# Patient Record
Sex: Female | Born: 1990 | Hispanic: Yes | Marital: Married | Smoking: Never smoker
Health system: Southern US, Community
[De-identification: ages and names within clinical notes are randomized; demographics above are authoritative.]

## PROBLEM LIST (undated history)

## (undated) DIAGNOSIS — Z789 Other specified health status: Secondary | ICD-10-CM

## (undated) HISTORY — DX: Other specified health status: Z78.9

## (undated) HISTORY — PX: NO PAST SURGERIES: SHX2092

---

## 2010-10-18 ENCOUNTER — Inpatient Hospital Stay (HOSPITAL_COMMUNITY)
Admission: AD | Admit: 2010-10-18 | Discharge: 2010-10-21 | DRG: 775 | Disposition: A | Discharge status: Home / Self Care | Payer: Medicaid Other | Source: Ambulatory Visit | Attending: Obstetrics & Gynecology | Admitting: Obstetrics & Gynecology

## 2010-10-18 DIAGNOSIS — O480 Post-term pregnancy: Principal | ICD-10-CM | POA: Diagnosis present

## 2010-10-18 LAB — CBC
MCHC: 35.1 g/dL (ref 30.0–36.0)
RDW: 13.9 % (ref 11.5–15.5)
WBC: 7.6 10*3/uL (ref 4.0–10.5)

## 2010-10-19 DIAGNOSIS — O480 Post-term pregnancy: Secondary | ICD-10-CM

## 2010-10-19 LAB — RPR: RPR Ser Ql: NONREACTIVE

## 2014-11-11 LAB — OB RESULTS CONSOLE GBS
GBS: POSITIVE
STREP GROUP B AG: POSITIVE
STREP GROUP B AG: POSITIVE

## 2014-11-14 ENCOUNTER — Inpatient Hospital Stay (HOSPITAL_COMMUNITY): Payer: Medicaid Other

## 2014-12-08 ENCOUNTER — Inpatient Hospital Stay (HOSPITAL_COMMUNITY)
Admission: AD | Admit: 2014-12-08 | Discharge: 2014-12-10 | DRG: 775 | Disposition: A | Discharge status: Home / Self Care | Payer: BLUE CROSS/BLUE SHIELD | Source: Ambulatory Visit | Attending: Obstetrics and Gynecology | Admitting: Obstetrics and Gynecology

## 2014-12-08 ENCOUNTER — Encounter (HOSPITAL_COMMUNITY): Payer: Self-pay | Admitting: *Deleted

## 2014-12-08 DIAGNOSIS — O133 Gestational [pregnancy-induced] hypertension without significant proteinuria, third trimester: Secondary | ICD-10-CM | POA: Diagnosis present

## 2014-12-08 DIAGNOSIS — Z3A39 39 weeks gestation of pregnancy: Secondary | ICD-10-CM | POA: Diagnosis present

## 2014-12-08 DIAGNOSIS — Z3490 Encounter for supervision of normal pregnancy, unspecified, unspecified trimester: Secondary | ICD-10-CM

## 2014-12-08 DIAGNOSIS — O99824 Streptococcus B carrier state complicating childbirth: Secondary | ICD-10-CM | POA: Diagnosis present

## 2014-12-08 LAB — OB RESULTS CONSOLE GC/CHLAMYDIA
Chlamydia: NEGATIVE
GC PROBE AMP, GENITAL: NEGATIVE

## 2014-12-08 LAB — OB RESULTS CONSOLE HEPATITIS B SURFACE ANTIGEN: Hepatitis B Surface Ag: NEGATIVE

## 2014-12-08 LAB — CBC
HEMATOCRIT: 33.3 % — AB (ref 36.0–46.0)
HEMOGLOBIN: 11 g/dL — AB (ref 12.0–15.0)
MCH: 25.3 pg — AB (ref 26.0–34.0)
MCHC: 33 g/dL (ref 30.0–36.0)
MCV: 76.7 fL — ABNORMAL LOW (ref 78.0–100.0)
Platelets: 214 10*3/uL (ref 150–400)
RBC: 4.34 MIL/uL (ref 3.87–5.11)
RDW: 15.2 % (ref 11.5–15.5)
WBC: 6.7 10*3/uL (ref 4.0–10.5)

## 2014-12-08 LAB — OB RESULTS CONSOLE ANTIBODY SCREEN: Antibody Screen: NEGATIVE

## 2014-12-08 LAB — OB RESULTS CONSOLE RPR: RPR: NONREACTIVE

## 2014-12-08 LAB — OB RESULTS CONSOLE ABO/RH: RH Type: POSITIVE

## 2014-12-08 LAB — TYPE AND SCREEN
ABO/RH(D): A POS
Antibody Screen: NEGATIVE

## 2014-12-08 LAB — OB RESULTS CONSOLE RUBELLA ANTIBODY, IGM: RUBELLA: IMMUNE

## 2014-12-08 LAB — ABO/RH: ABO/RH(D): A POS

## 2014-12-08 LAB — OB RESULTS CONSOLE HIV ANTIBODY (ROUTINE TESTING): HIV: NONREACTIVE

## 2014-12-08 LAB — OB RESULTS CONSOLE GBS
STREP GROUP B AG: NEGATIVE
STREP GROUP B AG: POSITIVE

## 2014-12-08 MED ORDER — TERBUTALINE SULFATE 1 MG/ML IJ SOLN: 0.2500 mg | Freq: Once | INTRAMUSCULAR | Status: AC | PRN

## 2014-12-08 MED ORDER — LIDOCAINE HCL (PF) 1 % IJ SOLN
30.0000 mL | INTRAMUSCULAR | Status: DC | PRN
  Filled 2014-12-08: qty 30

## 2014-12-08 MED ORDER — LACTATED RINGERS IV SOLN
INTRAVENOUS | Status: DC
  Administered 2014-12-08 – 2014-12-09 (×3): via INTRAVENOUS

## 2014-12-08 MED ORDER — LACTATED RINGERS IV SOLN: 500.0000 mL | INTRAVENOUS | Status: DC | PRN

## 2014-12-08 MED ORDER — ACETAMINOPHEN 325 MG PO TABS: 650.0000 mg | ORAL_TABLET | ORAL | Status: DC | PRN

## 2014-12-08 MED ORDER — CITRIC ACID-SODIUM CITRATE 334-500 MG/5ML PO SOLN: 30.0000 mL | ORAL | Status: DC | PRN

## 2014-12-08 MED ORDER — PENICILLIN G POTASSIUM 5000000 UNITS IJ SOLR
5.0000 10*6.[IU] | Freq: Once | INTRAVENOUS | Status: AC
  Administered 2014-12-08: 5 10*6.[IU] via INTRAVENOUS
  Filled 2014-12-08: qty 5

## 2014-12-08 MED ORDER — OXYTOCIN 40 UNITS IN LACTATED RINGERS INFUSION - SIMPLE MED
62.5000 mL/h | INTRAVENOUS | Status: DC
  Administered 2014-12-09: 62.5 mL/h via INTRAVENOUS

## 2014-12-08 MED ORDER — PENICILLIN G POTASSIUM 5000000 UNITS IJ SOLR
2.5000 10*6.[IU] | INTRAVENOUS | Status: DC
  Administered 2014-12-08 – 2014-12-09 (×3): 2.5 10*6.[IU] via INTRAVENOUS
  Filled 2014-12-08 (×8): qty 2.5

## 2014-12-08 MED ORDER — OXYTOCIN 40 UNITS IN LACTATED RINGERS INFUSION - SIMPLE MED
1.0000 m[IU]/min | INTRAVENOUS | Status: DC
  Administered 2014-12-08: 1 m[IU]/min via INTRAVENOUS
  Administered 2014-12-09: 5 m[IU]/min via INTRAVENOUS
  Filled 2014-12-08: qty 1000

## 2014-12-08 MED ORDER — BUTORPHANOL TARTRATE 1 MG/ML IJ SOLN
1.0000 mg | Freq: Once | INTRAMUSCULAR | Status: AC
  Administered 2014-12-08: 1 mg via INTRAVENOUS
  Filled 2014-12-08: qty 1

## 2014-12-08 MED ORDER — OXYCODONE-ACETAMINOPHEN 5-325 MG PO TABS: 1.0000 | ORAL_TABLET | ORAL | Status: DC | PRN

## 2014-12-08 MED ORDER — OXYCODONE-ACETAMINOPHEN 5-325 MG PO TABS: 2.0000 | ORAL_TABLET | ORAL | Status: DC | PRN

## 2014-12-08 MED ORDER — OXYTOCIN BOLUS FROM INFUSION
500.0000 mL | INTRAVENOUS | Status: DC
  Administered 2014-12-09: 500 mL via INTRAVENOUS

## 2014-12-08 NOTE — Progress Notes (Signed)
Patient ID: Sarah Harris, female   DOB: [birth year], 24 y.o.   MRN: [id number] Pt is contracting regularly q 2-3 minutes and they are more painful. Per the RN she waas 3-4 cm 50% effaced at 8:30 PM and now the cervix is unchanged She needs more pitocin.

## 2014-12-08 NOTE — H&P (Signed)
Sarah Harris, Sarah Harris NO.:  [id number]  MEDICAL RECORD NO.:  [id number]  LOCATION:  9174                          FACILITY:  [facility]  PHYSICIAN:  Malachi Pro. Ambrose Mantle, M.D. DATE OF BIRTH:  03-15-1991  DATE OF ADMISSION:  12/08/2014 DATE OF DISCHARGE:                             HISTORY & PHYSICAL   HISTORY OF PRESENT ILLNESS:  This is a 24 year old Hispanic female, para 1-0-0-1, gravida 2, with [facility] December 14, 2014, admitted for induction of labor.  This patient had a vaginal delivery with no epidural three years ago.  She presented for her prenatal care at [redacted] weeks gestation, and her EDC was given by the ultrasound at 10 weeks and 1 day as December 13, 2014 and that was the due date.  So, she is 39 weeks and 2 days.  Her prenatal labs showed A positive, negative antibody.  RPR negative. Urine culture negative.  Hepatitis B surface antigen negative, HIV negative, GC and Chlamydia negative.  Varicella immune.  Rubella immune. Pap smear normal.  One-hour Glucola 84.  Repeat RPR and HIV were negative.  Repeat GC and Chlamydia were negative.  Group B strep was positive.  She declined screening tests.  At one point at 15 weeks, she did have an upper respiratory infection.  She was advised to use conservative measures and report any temperature greater than 100.4 degrees.  Her prenatal care was complicated somewhat by poor weight gain.  Total weight gain has been 14.9 pounds.  At 36 weeks, the vertex was not felt to be breeched by palpation, but I did an ultrasound at 38 weeks, and vertex was confirmed by ultrasound.  On 12/08/2014, the cervix was 2 cm, 20%, vertex at a -3; and she was sent to the hospital for induction of labor.  PAST MEDICAL HISTORY:  Reveals no known significant medical illnesses.  PAST SURGICAL HISTORY:  She has had no surgeries.  ALLERGIES:  She is not allergic to any medicines.  SOCIAL HISTORY:  She never smoked.  Denies alcohol, denies drugs.  She has  12 years of education.  She works as a Designer, industrial/product at [employer]. She is married.  FAMILY HISTORY:  Maternal grandfather died of cancer of the lung and diabetes.  Sister has spina bifida occulta.  PHYSICAL EXAMINATION:  GENERAL:  Physical exam on admission, a well- developed, well-nourished Hispanic female, in no distress. VITAL SIGNS:  Blood pressure 100/60, pulse of 80. HEART:  Normal size and sounds.  No murmurs. LUNGS:  Clear to auscultation. ABDOMEN:  Fundal height is 37.5 cm.  Fetal heart tones are normal. Cervix is 2 cm, 20%-30% effaced, vertex at a -3.  ADMITTING IMPRESSION:  Intrauterine pregnancy at 39 weeks and 2 days. Positive group B strep.  The patient is admitted for induction of labor.     Malachi Pro. Ambrose Mantle, M.D.     TFH/MEDQ  D:  12/08/2014  T:  12/08/2014  Job:  [id number]

## 2014-12-08 NOTE — Progress Notes (Signed)
Patient ID: Sarah Harris, female   DOB: [birth year], 24 y.o.   MRN: [id number] Pitocin at 11 mu/minute and contractions painless and q 2-3 minutes The cervix is 2 cm 30% effaced and the vertex is at - 3 station. AROM produced clear fluid

## 2014-12-08 NOTE — Progress Notes (Signed)
Patient ID: Sarah Harris, female   DOB: [birth year], 24 y.o.   MRN: [id number]  Cervix is 2 cm 40 % effaced and the vertex is at - 3 station.

## 2014-12-09 ENCOUNTER — Encounter (HOSPITAL_COMMUNITY): Payer: Self-pay | Admitting: *Deleted

## 2014-12-09 LAB — RPR: RPR Ser Ql: NONREACTIVE

## 2014-12-09 MED ORDER — OXYTOCIN 40 UNITS IN LACTATED RINGERS INFUSION - SIMPLE MED: 62.5000 mL/h | INTRAVENOUS | Status: AC

## 2014-12-09 MED ORDER — DIPHENHYDRAMINE HCL 50 MG/ML IJ SOLN: 12.5000 mg | INTRAMUSCULAR | Status: DC | PRN

## 2014-12-09 MED ORDER — PHENYLEPHRINE 40 MCG/ML (10ML) SYRINGE FOR IV PUSH (FOR BLOOD PRESSURE SUPPORT): 80.0000 ug | PREFILLED_SYRINGE | INTRAVENOUS | Status: DC | PRN

## 2014-12-09 MED ORDER — TETANUS-DIPHTH-ACELL PERTUSSIS 5-2.5-18.5 LF-MCG/0.5 IM SUSP: 0.5000 mL | Freq: Once | INTRAMUSCULAR | Status: DC

## 2014-12-09 MED ORDER — LANOLIN HYDROUS EX OINT: TOPICAL_OINTMENT | CUTANEOUS | Status: DC | PRN

## 2014-12-09 MED ORDER — BENZOCAINE-MENTHOL 20-0.5 % EX AERO: 1.0000 "application " | INHALATION_SPRAY | CUTANEOUS | Status: DC | PRN

## 2014-12-09 MED ORDER — LACTATED RINGERS IV SOLN: INTRAVENOUS | Status: AC

## 2014-12-09 MED ORDER — ONDANSETRON HCL 4 MG/2ML IJ SOLN
4.0000 mg | INTRAMUSCULAR | Status: DC | PRN
Start: 2014-12-09 — End: 2014-12-10

## 2014-12-09 MED ORDER — DIPHENHYDRAMINE HCL 25 MG PO CAPS: 25.0000 mg | ORAL_CAPSULE | Freq: Four times a day (QID) | ORAL | Status: DC | PRN

## 2014-12-09 MED ORDER — PRENATAL MULTIVITAMIN CH
1.0000 | ORAL_TABLET | Freq: Every day | ORAL | Status: DC
  Administered 2014-12-10: 1 via ORAL
  Filled 2014-12-09: qty 1

## 2014-12-09 MED ORDER — SIMETHICONE 80 MG PO CHEW: 80.0000 mg | CHEWABLE_TABLET | ORAL | Status: DC | PRN

## 2014-12-09 MED ORDER — MEASLES, MUMPS & RUBELLA VAC ~~LOC~~ INJ: 0.5000 mL | INJECTION | Freq: Once | SUBCUTANEOUS | Status: DC

## 2014-12-09 MED ORDER — OXYCODONE-ACETAMINOPHEN 5-325 MG PO TABS: 2.0000 | ORAL_TABLET | ORAL | Status: DC | PRN

## 2014-12-09 MED ORDER — PHENYLEPHRINE 40 MCG/ML (10ML) SYRINGE FOR IV PUSH (FOR BLOOD PRESSURE SUPPORT)
PREFILLED_SYRINGE | INTRAVENOUS | Status: AC
  Filled 2014-12-09: qty 20

## 2014-12-09 MED ORDER — FENTANYL 2.5 MCG/ML BUPIVACAINE 1/10 % EPIDURAL INFUSION (WH - ANES): 14.0000 mL/h | INTRAMUSCULAR | Status: DC | PRN

## 2014-12-09 MED ORDER — WITCH HAZEL-GLYCERIN EX PADS: 1.0000 "application " | MEDICATED_PAD | CUTANEOUS | Status: DC | PRN

## 2014-12-09 MED ORDER — ZOLPIDEM TARTRATE 5 MG PO TABS: 5.0000 mg | ORAL_TABLET | Freq: Every evening | ORAL | Status: DC | PRN

## 2014-12-09 MED ORDER — ONDANSETRON HCL 4 MG PO TABS: 4.0000 mg | ORAL_TABLET | ORAL | Status: DC | PRN

## 2014-12-09 MED ORDER — IBUPROFEN 600 MG PO TABS
600.0000 mg | ORAL_TABLET | Freq: Four times a day (QID) | ORAL | Status: DC
  Administered 2014-12-09 – 2014-12-10 (×6): 600 mg via ORAL
  Filled 2014-12-09 (×6): qty 1

## 2014-12-09 MED ORDER — ACETAMINOPHEN 325 MG PO TABS: 650.0000 mg | ORAL_TABLET | ORAL | Status: DC | PRN

## 2014-12-09 MED ORDER — DIBUCAINE 1 % RE OINT: 1.0000 "application " | TOPICAL_OINTMENT | RECTAL | Status: DC | PRN

## 2014-12-09 MED ORDER — BUTORPHANOL TARTRATE 1 MG/ML IJ SOLN
1.0000 mg | INTRAMUSCULAR | Status: DC | PRN
  Administered 2014-12-09: 1 mg via INTRAVENOUS
  Filled 2014-12-09: qty 1

## 2014-12-09 MED ORDER — OXYCODONE-ACETAMINOPHEN 5-325 MG PO TABS
1.0000 | ORAL_TABLET | ORAL | Status: DC | PRN
  Administered 2014-12-09: 1 via ORAL
  Filled 2014-12-09: qty 1

## 2014-12-09 MED ORDER — FENTANYL 2.5 MCG/ML BUPIVACAINE 1/10 % EPIDURAL INFUSION (WH - ANES)
INTRAMUSCULAR | Status: AC
  Filled 2014-12-09: qty 125

## 2014-12-09 MED ORDER — SENNOSIDES-DOCUSATE SODIUM 8.6-50 MG PO TABS
2.0000 | ORAL_TABLET | ORAL | Status: DC
Start: 2014-12-10 — End: 2014-12-10
  Administered 2014-12-09: 2 via ORAL
  Filled 2014-12-09: qty 2

## 2014-12-09 MED ORDER — PRENATAL MULTIVITAMIN CH: 1.0000 | ORAL_TABLET | Freq: Every day | ORAL | Status: DC

## 2014-12-09 MED ORDER — EPHEDRINE 5 MG/ML INJ: 10.0000 mg | INTRAVENOUS | Status: DC | PRN

## 2014-12-09 NOTE — Progress Notes (Signed)
Patient ID: Sarah Harris, female   DOB: [birth year], 24 y.o.   MRN: [id number] After the pitocin was d/c ed and the contractions spaced out the pitocin was begun again at 5 mu/minute and now is at 9 mu/minute. The contractions are q 2-4 minutes. The FHR is normal and the cervix is 4-5 cm 80% effaced, anterior and the vertex is at - 2 station.

## 2014-12-09 NOTE — Lactation Note (Signed)
This note was copied from the chart of Sarah Harris. Lactation Consultation Note  Patient Name: Sarah Harris ZOXWR'U Date: 12/09/2014 Reason for consult: Initial assessment Mom had asked RN for bottle. Mom reports BR/BO with 1st child but had LMS by 1 month and stopped breastfeeding. LC demonstrated hand expression and colostrum drips from Mom's breast. Reviewed risk of early supplementation to BF success. Reviewed supply/demand. Encouraged Mom to keep baby at the breast, basic teaching reviewed. Lactation brochure left for review, advised of OP services and support group. Encouraged to call for assist as needed.   Maternal Data Has patient been taught Hand Expression?: Yes Does the patient have breastfeeding experience prior to this delivery?: Yes  Feeding Feeding Type: Breast Fed  LATCH Score/Interventions Latch: Grasps breast easily, tongue down, lips flanged, rhythmical sucking.  Audible Swallowing: A few with stimulation  Type of Nipple: Everted at rest and after stimulation  Comfort (Breast/Nipple): Soft / non-tender     Hold (Positioning): Assistance needed to correctly position infant at breast and maintain latch.  LATCH Score: 8  Lactation Tools Discussed/Used WIC Program: No   Consult Status Consult Status: Follow-up Date: 12/10/14 Follow-up type: In-patient    Alfred Levins 12/09/2014, 9:13 PM

## 2014-12-09 NOTE — Progress Notes (Signed)
Patient ID: Sarah Harris, female   DOB: January 27, 1991, 24 y.o.   MRN: [id number] I was called to the pt's room because she was feeling pressure and the RN could not feel the cervix.The cervix was 4-5 cm 60 % effaced and the vertex was at - 3  station The FHR was inadequately traced and suggested there may have been some bradycardia so the pitocin was stopped and an RN placed a scalp electrode  Confirming a normal FHR Without pitocinher contractions were initially unchanged but now have begun to space out.

## 2014-12-09 NOTE — Progress Notes (Signed)
Patient ID: Sarah Harris, female   DOB: [birth year], 24 y.o.   MRN: [id number] Delivery note:  The pt progressed from 4-5 cm over the next hour to 6 cm , then to an anterior lip. She was in severe pain so we asked for an epidural but by the time the epidural was available we felt she was too uncomfortable to sit still. She then progressed to full dilatation and pushed well to make very slow descent. She delivered a living female infant spontaneously LOA over an intact perineum. There was no shoulder dystocia. Apgars were 9 and 9 at 1 and 5 minutes. The placenta was intact and the uterus was normal. There were 2 very small lacerations on the right labia and above the urethral meatus that were hemostatic and not repaired. EBL 75 cc's.

## 2014-12-09 NOTE — Progress Notes (Signed)
Patient ID: Sarah Harris, female   DOB: 1991/04/11, 24 y.o.   MRN: [id number] DOD VS stable No complaints

## 2014-12-10 LAB — CBC
HCT: 33.4 % — ABNORMAL LOW (ref 36.0–46.0)
Hemoglobin: 10.6 g/dL — ABNORMAL LOW (ref 12.0–15.0)
MCH: 24.7 pg — ABNORMAL LOW (ref 26.0–34.0)
MCHC: 31.7 g/dL (ref 30.0–36.0)
MCV: 77.7 fL — ABNORMAL LOW (ref 78.0–100.0)
Platelets: 195 10*3/uL (ref 150–400)
RBC: 4.3 MIL/uL (ref 3.87–5.11)
RDW: 15.6 % — ABNORMAL HIGH (ref 11.5–15.5)
WBC: 11.9 10*3/uL — ABNORMAL HIGH (ref 4.0–10.5)

## 2014-12-10 MED ORDER — IBUPROFEN 600 MG PO TABS: 600.0000 mg | ORAL_TABLET | Freq: Four times a day (QID) | ORAL | Status: DC

## 2014-12-10 NOTE — Discharge Instructions (Signed)
Nothing in vagina for 6 weeks.  No sex, tampons, and douching.  Other instructions as in [facility] Discharge Booklet. °

## 2014-12-10 NOTE — Progress Notes (Signed)
OB Discharge Summary  Patient Name: Sarah Harris DOB: [birth year] MRN: [id number]  Date of admission: 12/08/2014     Date of discharge: No discharge date for patient encounter.  Admitting diagnosis: Onset of Labor   Intrauterine pregnancy: [redacted]w[redacted]d     Secondary diagnosis: None     Discharge diagnosis: Term Pregnancy Delivered  Method of delivery:      Information for the patient's newborn:  Athleen, Feltner [[id number]]  Delivery Method: Vaginal, Spontaneous Delivery (Filed from Delivery Summary)                                                                                                     Intrapartum Procedures:                                                          Post partum procedures:none      Hospital course:  Onset of Labor With Vaginal Delivery     24 y.o. yo G2P1001 at [redacted]w[redacted]d was admitted in Active Laboron 12/08/2014. Patient had an uncomplicated labor course as follows:       Length of Stage 3:                                    Mediations and procedures used include: Pitocin  Patient had a delivery of a Viable infant. 12/09/2014  Information for the patient's newborn:  Marijose, Curington [[id number]]  Delivery Method: Vaginal, Spontaneous Delivery (Filed from Delivery Summary)    Pateint had an uncomplicated postpartum course.  She is ambulating, tolerating a regular diet, passing flatus, and urinating well. Patient is discharged home in stable condition on No discharge date for patient encounter.Marland Kitchen    Physical exam  Filed Vitals:   12/09/14 1223 12/09/14 1353 12/09/14 1750 12/10/14 0515  BP: 119/70 109/66 117/65 98/54  Pulse: 66 65 70 63  Temp: 97.8 F (36.6 C) 98.8 F (37.1 C) 98.1 F (36.7 C) 98.3 F (36.8 C)  TempSrc: Oral Oral Oral   Resp: Height:      Weight:       General: alert, cooperative and no distress Lochia: appropriate Uterine Fundus: firm DVT Evaluation: No evidence of DVT seen on physical  exam. Labs: Lab Results  Component Value Date   WBC 11.9* 12/10/2014   HGB 10.6* 12/10/2014   HCT 33.4* 12/10/2014   MCV 77.7* 12/10/2014   PLT 195 12/10/2014   No flowsheet data found.  Discharge instruction: per After Visit Summary and "Baby and Me Booklet".  Medications:   Medication List    TAKE these medications        ibuprofen 600 MG tablet  Commonly known as:  ADVIL,MOTRIN  Take 1 tablet (600 mg total) by mouth every 6 (six) hours.     prenatal multivitamin  Tabs tablet  Take 1 tablet by mouth daily at 12 noon.        Diet: routine diet  Activity: Advance as tolerated. Pelvic rest for 6 weeks.   Outpatient follow up:6 weeks  Postpartum contraception: Nexplanon  Newborn Data: Live born female  Birth Weight: 6 lb 11.1 oz (3035 g) APGAR: 9, 9  Baby Feeding: Breast Disposition:home with mother   12/10/2014 Sharol Given Zenon Leaf, DO

## 2014-12-10 NOTE — Lactation Note (Signed)
This note was copied from the chart of Sarah Harris. Lactation Consultation Note  Discussed waking techniques, undressing baby to diaper for feedings and encouraged longer feedings. Suggest she breastfeed on both breasts, burping in between. Reviewed engorgement care and monitoring voids/stools and keep track.  Patient Name: Sarah Sirinity Outland ZOXWR'U Date: 12/10/2014 Reason for consult: Follow-up assessment   Maternal Data    Feeding Feeding Type: Breast Fed Length of feed: 26 min  LATCH Score/Interventions Latch: Grasps breast easily, tongue down, lips flanged, rhythmical sucking.  Audible Swallowing: A few with stimulation (Fell asleep shortly after starting to breastfeeding)  Type of Nipple: Everted at rest and after stimulation  Comfort (Breast/Nipple): Soft / non-tender     Hold (Positioning): Assistance needed to correctly position infant at breast and maintain latch.  LATCH Score: 8  Lactation Tools Discussed/Used     Consult Status Consult Status: Complete    Hardie Pulley 12/10/2014, 12:11 PM

## 2014-12-10 NOTE — Discharge Summary (Addendum)
OB Discharge Summary  Patient Name: Sarah Harris DOB: 11-Mar-1991 MRN: [id number]  Date of admission: 12/08/2014     Date of discharge: 12/10/14  Admitting diagnosis: Induction of labor    Intrauterine pregnancy: [redacted]w[redacted]d     Secondary diagnosis: Gestational Hypertension     Discharge diagnosis: Term Pregnancy Delivered  Method of delivery:      Information for the patient's newborn:  Rielynn, Trulson [[id number]]  Delivery Method: Vaginal, Spontaneous Delivery (Filed from Delivery Summary)                                                                                                      Intrapartum Procedures:                                                          Post partum procedures:none      Hospital course:  Induction of Labor With Vaginal Delivery   24 y.o. yo G2P1001 at [redacted]w[redacted]d was admitted to the hospital 12/08/2014 for induction of labor.  Indication for induction: Gestational hypertension.  Patient had an uncomplicated labor course as follows:  Mediations and procedures used include: Cytotec      Length of stage 3:                                       Patient had delivery of a Viable infant.  Information for the patient's newborn:  Pinkey, Mcjunkin [[id number]]  Delivery Method: Vaginal, Spontaneous Delivery (Filed from Delivery Summary)   12/09/2014  Details of delivery can be found in separate delivery note.  Patient had a routine postpartum course. Patient is discharged home No discharge date for patient encounter.    Physical exam  Filed Vitals:   12/09/14 1223 12/09/14 1353 12/09/14 1750 12/10/14 0515  BP: 119/70 109/66 117/65 98/54  Pulse: 66 65 70 63  Temp: 97.8 F (36.6 C) 98.8 F (37.1 C) 98.1 F (36.7 C) 98.3 F (36.8 C)  TempSrc: Oral Oral Oral   Resp: Height:      Weight:       General: alert, cooperative and no distress Lochia: appropriate Uterine Fundus: firm DVT Evaluation: No evidence of DVT seen on  physical exam. Labs: Lab Results  Component Value Date   WBC 11.9* 12/10/2014   HGB 10.6* 12/10/2014   HCT 33.4* 12/10/2014   MCV 77.7* 12/10/2014   PLT 195 12/10/2014   No flowsheet data found.  Discharge instruction: per After Visit Summary and "Baby and Me Booklet".  Medications:   Medication List    TAKE these medications        ibuprofen 600 MG tablet  Commonly known as:  ADVIL,MOTRIN  Take 1 tablet (600 mg total) by mouth every 6 (six) hours.  prenatal multivitamin Tabs tablet  Take 1 tablet by mouth daily at 12 noon.        Diet: routine diet  Activity: Advance as tolerated. Pelvic rest for 6 weeks.   Outpatient follow up:6 weeks  Postpartum contraception: Nexplanon  Newborn Data: Live born female  Birth Weight: 6 lb 11.1 oz (3035 g) APGAR: 9, 9  Baby Feeding: Breast Disposition:home with mother   12/10/2014 Sharol Given Banga, DO

## 2021-05-06 NOTE — L&D Delivery Note (Signed)
Operative Delivery Note    Pt c/o excessive pressure and began to involuntarily push at 9cm dilation.  She was placed in hands and knees and after several contractions the cervix was able to be reduced and she was completely dilated.  She was unable to labor down due to discomfort, and began pushing at 2115pm. We pushed for a little over an hour in multiple positions and she was able to bring the baby to a +2-+3 station, but could not get the head to crowning.  The FHR had some repetitive variables to 90's and recovered to about 105.  SHe was counseled on options and desired assistance to deliver with a vacuum. The team was called to attend and a mushroom vacuum was applied into the green zone. With no pop-offs over two contractions the baby was pulled to crowning. Over several more contractions the patient was able to deliver the head.  A tight nuchal was noted and delivered through.   Once the head delivered, there was a mild shoulder dystocia but with McRoberts  maneuver and anteriorly sweeping the posterior right shoulder forward, the shoulders easily delivered.  At 10:29 PM a healthy female was delivered via Vaginal, Vacuum Investment banker, operational).  Presentation: vertex; Position: Occiput,, Anterior; Station: +2.  Verbal consent: obtained from family.  Risks and benefits discussed in detail.  Risks include, but are not limited to the risks of anesthesia, bleeding, infection, damage to maternal tissues, fetal cephalhematoma.  There is also the risk of inability to effect vaginal delivery of the head, or shoulder dystocia that cannot be resolved by established maneuvers, leading to the need for emergency cesarean section.  APGAR: 8, 9; weight  .   Placenta status: delivered spontaneously .   Cord:  with the following complications: tight nuchal delivered through.    Anesthesia:  1%plain lidocaine block Instruments: mushroom  vacuum Episiotomy: None Lacerations: 2nd degree;Perineal Suture Repair: 2.0 vicryl  rapide Est. Blood Loss (mL):   Mom to postpartum.  Baby to Couplet care / Skin to Skin.  Oliver Pila 12/01/2021, 11:00 PM

## 2021-05-11 LAB — OB RESULTS CONSOLE ABO/RH: RH Type: POSITIVE

## 2021-05-11 LAB — OB RESULTS CONSOLE HEPATITIS B SURFACE ANTIGEN: Hepatitis B Surface Ag: NEGATIVE

## 2021-05-11 LAB — OB RESULTS CONSOLE RUBELLA ANTIBODY, IGM: Rubella: IMMUNE

## 2021-05-11 LAB — OB RESULTS CONSOLE GC/CHLAMYDIA
Chlamydia: NEGATIVE
Neisseria Gonorrhea: NEGATIVE

## 2021-05-11 LAB — OB RESULTS CONSOLE HIV ANTIBODY (ROUTINE TESTING): HIV: NONREACTIVE

## 2021-05-11 LAB — HEPATITIS C ANTIBODY: HCV Ab: NEGATIVE

## 2021-05-11 LAB — OB RESULTS CONSOLE ANTIBODY SCREEN: Antibody Screen: NEGATIVE

## 2021-05-18 ENCOUNTER — Other Ambulatory Visit: Payer: Self-pay

## 2021-07-12 ENCOUNTER — Other Ambulatory Visit: Payer: Self-pay | Admitting: Obstetrics and Gynecology

## 2021-07-12 DIAGNOSIS — Z3A20 20 weeks gestation of pregnancy: Secondary | ICD-10-CM

## 2021-07-12 DIAGNOSIS — Z363 Encounter for antenatal screening for malformations: Secondary | ICD-10-CM

## 2021-07-12 DIAGNOSIS — O283 Abnormal ultrasonic finding on antenatal screening of mother: Secondary | ICD-10-CM

## 2021-07-13 ENCOUNTER — Encounter: Payer: Self-pay | Admitting: *Deleted

## 2021-07-19 ENCOUNTER — Other Ambulatory Visit: Payer: Self-pay

## 2021-07-19 ENCOUNTER — Encounter: Payer: Self-pay | Admitting: *Deleted

## 2021-07-19 ENCOUNTER — Ambulatory Visit: Payer: BC Managed Care – PPO | Attending: Obstetrics and Gynecology

## 2021-07-19 ENCOUNTER — Ambulatory Visit: Payer: BC Managed Care – PPO | Admitting: *Deleted

## 2021-07-19 VITALS — BP 114/61 | HR 90

## 2021-07-19 DIAGNOSIS — Z363 Encounter for antenatal screening for malformations: Secondary | ICD-10-CM | POA: Diagnosis present

## 2021-07-19 DIAGNOSIS — O283 Abnormal ultrasonic finding on antenatal screening of mother: Secondary | ICD-10-CM | POA: Diagnosis present

## 2021-07-19 DIAGNOSIS — Z3A20 20 weeks gestation of pregnancy: Secondary | ICD-10-CM | POA: Diagnosis present

## 2021-09-10 LAB — OB RESULTS CONSOLE RPR: RPR: NONREACTIVE

## 2021-11-07 LAB — OB RESULTS CONSOLE GBS: GBS: NEGATIVE

## 2021-11-30 ENCOUNTER — Telehealth (HOSPITAL_COMMUNITY): Payer: Self-pay | Admitting: *Deleted

## 2021-11-30 ENCOUNTER — Encounter (HOSPITAL_COMMUNITY): Payer: Self-pay | Admitting: *Deleted

## 2021-11-30 ENCOUNTER — Other Ambulatory Visit: Payer: Self-pay | Admitting: Obstetrics and Gynecology

## 2021-11-30 DIAGNOSIS — Z3490 Encounter for supervision of normal pregnancy, unspecified, unspecified trimester: Secondary | ICD-10-CM

## 2021-11-30 NOTE — Telephone Encounter (Signed)
Preadmission screen  

## 2021-11-30 NOTE — H&P (Signed)
  The note originally documented on this encounter has been moved the the encounter in which it belongs.  

## 2021-11-30 NOTE — H&P (Signed)
Sarah Harris is a 31 y.o. female G3P2002 at 52 5/7 weeks (EDD 12/03/21 by LMP c/w 10 week [place]) presenting for IOL.  Prenatal care uneventful.   OB History     Gravida  3   Para  2   Term  2   Preterm  0   AB  0   Living  2      SAB  0   IAB  0   Ectopic  0   Multiple  0   Live Births  1         10-19-2010, 42 wks M, 7lbs 9oz, Vaginal Delivery  12-09-2014, 39.2 wks F, 6lbs 11oz, Vaginal Delivery Past Medical History:  Diagnosis Date   Medical history non-contributory    Past Surgical History:  Procedure Laterality Date   NO PAST SURGERIES     Family History: family history includes Cancer in her maternal grandfather; Diabetes in her mother and niece. Social History:  reports that she has never smoked. She has never used smokeless tobacco. She reports that she does not drink alcohol and does not use drugs.     Maternal Diabetes: No Genetic Screening: declined Maternal Ultrasounds/Referrals: Normal Fetal Ultrasounds or other Referrals:  Referred to Materal Fetal Medicine  Maternal Substance Abuse:  No Significant Maternal Medications:  None Significant Maternal Lab Results:  Group B Strep negative Number of Prenatal Visits:Less than or equal to 3 verified prenatal visits Other Comments:  None  Review of Systems  Constitutional:  Negative for fever.  Gastrointestinal:  Negative for abdominal pain.  Genitourinary:  Negative for vaginal bleeding.   Maternal Medical History:  Contractions: Frequency: irregular.   Perceived severity is mild.   Fetal activity: Perceived fetal activity is normal.   Prenatal Complications - Diabetes: none.     Last menstrual period 02/26/2021, unknown if currently breastfeeding. Maternal Exam:  Uterine Assessment: Contraction frequency is irregular.  Abdomen: Patient reports no abdominal tenderness. Fetal presentation: vertex Introitus: Normal vulva. Normal vagina.  Pelvis: adequate for delivery.     Physical  Exam Constitutional:      Appearance: Normal appearance.  Cardiovascular:     Rate and Rhythm: Normal rate and regular rhythm.  Pulmonary:     Effort: Pulmonary effort is normal.  Abdominal:     Palpations: Abdomen is soft.  Genitourinary:    General: Normal vulva.  Neurological:     General: No focal deficit present.     Mental Status: She is alert.  Psychiatric:        Mood and Affect: Mood normal.     Cervix 3+/50/-2   AROM clear  Prenatal labs: ABO, Rh: --/--/A POS (07/29 6301) Antibody: NEG (07/29 0921) Rubella: Immune (01/06 0000)immune RPR: NON REACTIVE (07/29 0921)  HBsAg: Negative (01/06 0000) negative HIV: Non-reactive (01/06 0000)  GBS: Negative/-- (07/05 0000)  One hour GCT 113 Hgb AA  Assessment/Plan: Pt admitted for elective IOL and started on pitocin, now with AROM.  Feeling mild contractions and baby category 1 tracing.   She plans a medication-free delivery. She expressed an interest in tubal sterilization, but had never mentioned previously.   We discussed that as an elective procedure anesthesia prefers regional anesthesia to perform immediately postpartum. She declines that option and states would rather do as an interval tubal with general anesthesia.    Oliver Pila 12/01/2021, 1:35 PM

## 2021-12-01 ENCOUNTER — Inpatient Hospital Stay (HOSPITAL_COMMUNITY): Payer: BC Managed Care – PPO

## 2021-12-01 ENCOUNTER — Inpatient Hospital Stay (HOSPITAL_COMMUNITY)
Admission: AD | Admit: 2021-12-01 | Discharge: 2021-12-03 | DRG: 807 | Disposition: A | Discharge status: Home / Self Care | Payer: BC Managed Care – PPO | Attending: Obstetrics and Gynecology | Admitting: Obstetrics and Gynecology

## 2021-12-01 ENCOUNTER — Encounter (HOSPITAL_COMMUNITY): Payer: Self-pay | Admitting: Obstetrics and Gynecology

## 2021-12-01 DIAGNOSIS — Z3A39 39 weeks gestation of pregnancy: Secondary | ICD-10-CM | POA: Diagnosis not present

## 2021-12-01 DIAGNOSIS — Z3490 Encounter for supervision of normal pregnancy, unspecified, unspecified trimester: Principal | ICD-10-CM | POA: Diagnosis present

## 2021-12-01 DIAGNOSIS — O26893 Other specified pregnancy related conditions, third trimester: Secondary | ICD-10-CM | POA: Diagnosis present

## 2021-12-01 LAB — CBC
HCT: 35.1 % — ABNORMAL LOW (ref 36.0–46.0)
Hemoglobin: 11.2 g/dL — ABNORMAL LOW (ref 12.0–15.0)
MCH: 24.9 pg — ABNORMAL LOW (ref 26.0–34.0)
MCHC: 31.9 g/dL (ref 30.0–36.0)
MCV: 78.2 fL — ABNORMAL LOW (ref 80.0–100.0)
Platelets: 211 10*3/uL (ref 150–400)
RBC: 4.49 MIL/uL (ref 3.87–5.11)
RDW: 15.1 % (ref 11.5–15.5)
WBC: 5.7 10*3/uL (ref 4.0–10.5)
nRBC: 0 % (ref 0.0–0.2)

## 2021-12-01 LAB — RPR: RPR Ser Ql: NONREACTIVE

## 2021-12-01 LAB — TYPE AND SCREEN
ABO/RH(D): A POS
Antibody Screen: NEGATIVE

## 2021-12-01 MED ORDER — ONDANSETRON HCL 4 MG/2ML IJ SOLN: 4.0000 mg | Freq: Four times a day (QID) | INTRAMUSCULAR | Status: DC | PRN

## 2021-12-01 MED ORDER — FENTANYL CITRATE (PF) 100 MCG/2ML IJ SOLN: 50.0000 ug | INTRAMUSCULAR | Status: DC | PRN

## 2021-12-01 MED ORDER — OXYTOCIN-SODIUM CHLORIDE 30-0.9 UT/500ML-% IV SOLN
2.5000 [IU]/h | INTRAVENOUS | Status: DC
  Administered 2021-12-01: 2.5 [IU]/h via INTRAVENOUS

## 2021-12-01 MED ORDER — ACETAMINOPHEN 325 MG PO TABS: 650.0000 mg | ORAL_TABLET | ORAL | Status: DC | PRN

## 2021-12-01 MED ORDER — TERBUTALINE SULFATE 1 MG/ML IJ SOLN: 0.2500 mg | Freq: Once | INTRAMUSCULAR | Status: DC | PRN

## 2021-12-01 MED ORDER — OXYTOCIN-SODIUM CHLORIDE 30-0.9 UT/500ML-% IV SOLN
1.0000 m[IU]/min | INTRAVENOUS | Status: DC
  Administered 2021-12-01 (×2): 2 m[IU]/min via INTRAVENOUS
  Filled 2021-12-01: qty 500

## 2021-12-01 MED ORDER — LIDOCAINE HCL (PF) 1 % IJ SOLN
30.0000 mL | INTRAMUSCULAR | Status: AC | PRN
  Administered 2021-12-01: 30 mL via SUBCUTANEOUS
  Filled 2021-12-01: qty 30

## 2021-12-01 MED ORDER — LACTATED RINGERS IV SOLN
500.0000 mL | INTRAVENOUS | Status: DC | PRN
  Administered 2021-12-01: 500 mL via INTRAVENOUS

## 2021-12-01 MED ORDER — SOD CITRATE-CITRIC ACID 500-334 MG/5ML PO SOLN: 30.0000 mL | ORAL | Status: DC | PRN

## 2021-12-01 MED ORDER — OXYCODONE-ACETAMINOPHEN 5-325 MG PO TABS: 2.0000 | ORAL_TABLET | ORAL | Status: DC | PRN

## 2021-12-01 MED ORDER — OXYCODONE-ACETAMINOPHEN 5-325 MG PO TABS: 1.0000 | ORAL_TABLET | ORAL | Status: DC | PRN

## 2021-12-01 MED ORDER — LACTATED RINGERS IV SOLN: INTRAVENOUS | Status: DC

## 2021-12-01 MED ORDER — OXYTOCIN BOLUS FROM INFUSION
333.0000 mL | Freq: Once | INTRAVENOUS | Status: AC
  Administered 2021-12-01: 333 mL via INTRAVENOUS

## 2021-12-01 NOTE — Lactation Note (Signed)
This note was copied from a baby's chart. Lactation Consultation Note Experienced BF mom declines Lactation services.  Patient Name: Sarah Harris Date: 12/01/2021   Age:31 hours  Maternal Data    Feeding    LATCH Score                    Lactation Tools Discussed/Used    Interventions    Discharge    Consult Status Consult Status: Complete    Charyl Dancer 12/01/2021, 11:29 PM

## 2021-12-01 NOTE — Progress Notes (Signed)
Patient ID: Sarah Harris, female   DOB: March 28, 1991, 31 y.o.   MRN: [id number] Pt starting to get more uncomfortable   Afeb VSS FHR category 1  Contractions q 2-3  Cervix 80/6/-1  I have to go to OR for emergent procedure, so will hold pitocin for now and restart when I am out of OR.  Pt coping with pain well.

## 2021-12-01 NOTE — Progress Notes (Signed)
Patient ID: Sarah Harris, female   DOB: 11-05-1990, 31 y.o.   MRN: [id number] Pt uncomfortable but coping well  Afeb VSS FHR category 1 basline 115-120  Cervix at 1900 80/7/-1  Pitocin restarted and will gradually increase to get contractions a bit closer

## 2021-12-02 ENCOUNTER — Other Ambulatory Visit: Payer: Self-pay

## 2021-12-02 ENCOUNTER — Encounter (HOSPITAL_COMMUNITY): Payer: Self-pay | Admitting: Obstetrics and Gynecology

## 2021-12-02 LAB — CBC
HCT: 33.9 % — ABNORMAL LOW (ref 36.0–46.0)
Hemoglobin: 10.9 g/dL — ABNORMAL LOW (ref 12.0–15.0)
MCH: 24.9 pg — ABNORMAL LOW (ref 26.0–34.0)
MCHC: 32.2 g/dL (ref 30.0–36.0)
MCV: 77.4 fL — ABNORMAL LOW (ref 80.0–100.0)
Platelets: 213 10*3/uL (ref 150–400)
RBC: 4.38 MIL/uL (ref 3.87–5.11)
RDW: 15.1 % (ref 11.5–15.5)
WBC: 14.8 10*3/uL — ABNORMAL HIGH (ref 4.0–10.5)
nRBC: 0 % (ref 0.0–0.2)

## 2021-12-02 MED ORDER — COCONUT OIL OIL: 1.0000 | TOPICAL_OIL | Status: DC | PRN

## 2021-12-02 MED ORDER — ACETAMINOPHEN 325 MG PO TABS: 650.0000 mg | ORAL_TABLET | ORAL | Status: DC | PRN

## 2021-12-02 MED ORDER — TETANUS-DIPHTH-ACELL PERTUSSIS 5-2.5-18.5 LF-MCG/0.5 IM SUSY: 0.5000 mL | PREFILLED_SYRINGE | Freq: Once | INTRAMUSCULAR | Status: DC

## 2021-12-02 MED ORDER — DIBUCAINE (PERIANAL) 1 % EX OINT: 1.0000 | TOPICAL_OINTMENT | CUTANEOUS | Status: DC | PRN

## 2021-12-02 MED ORDER — PRENATAL MULTIVITAMIN CH
1.0000 | ORAL_TABLET | Freq: Every day | ORAL | Status: DC
Start: 2021-12-02 — End: 2021-12-03
  Administered 2021-12-02 – 2021-12-03 (×2): 1 via ORAL
  Filled 2021-12-02 (×2): qty 1

## 2021-12-02 MED ORDER — ZOLPIDEM TARTRATE 5 MG PO TABS: 5.0000 mg | ORAL_TABLET | Freq: Every evening | ORAL | Status: DC | PRN

## 2021-12-02 MED ORDER — IBUPROFEN 600 MG PO TABS
600.0000 mg | ORAL_TABLET | Freq: Four times a day (QID) | ORAL | Status: DC
  Administered 2021-12-02 – 2021-12-03 (×6): 600 mg via ORAL
  Filled 2021-12-02 (×7): qty 1

## 2021-12-02 MED ORDER — ONDANSETRON HCL 4 MG/2ML IJ SOLN: 4.0000 mg | INTRAMUSCULAR | Status: DC | PRN

## 2021-12-02 MED ORDER — ONDANSETRON HCL 4 MG PO TABS: 4.0000 mg | ORAL_TABLET | ORAL | Status: DC | PRN

## 2021-12-02 MED ORDER — DIPHENHYDRAMINE HCL 25 MG PO CAPS: 25.0000 mg | ORAL_CAPSULE | Freq: Four times a day (QID) | ORAL | Status: DC | PRN

## 2021-12-02 MED ORDER — WITCH HAZEL-GLYCERIN EX PADS: 1.0000 | MEDICATED_PAD | CUTANEOUS | Status: DC | PRN

## 2021-12-02 MED ORDER — BENZOCAINE-MENTHOL 20-0.5 % EX AERO: 1.0000 | INHALATION_SPRAY | CUTANEOUS | Status: DC | PRN

## 2021-12-02 MED ORDER — SIMETHICONE 80 MG PO CHEW: 80.0000 mg | CHEWABLE_TABLET | ORAL | Status: DC | PRN

## 2021-12-02 MED ORDER — SENNOSIDES-DOCUSATE SODIUM 8.6-50 MG PO TABS
2.0000 | ORAL_TABLET | ORAL | Status: DC
Start: 2021-12-02 — End: 2021-12-03
  Administered 2021-12-02: 2 via ORAL
  Filled 2021-12-02: qty 2

## 2021-12-02 NOTE — Progress Notes (Signed)
Post Partum Day 1 Subjective: up ad lib, voiding, and tolerating PO  Pain controlled and baby feeding well  Objective: Blood pressure 115/65, pulse (!) 58, temperature 98.4 F (36.9 C), temperature source Oral, resp. rate 16, height 4\' 11"  (1.499 m), weight 66.6 kg, last menstrual period 02/26/2021, unknown if currently breastfeeding.  Physical Exam:  General: alert and cooperative Lochia: appropriate Uterine Fundus: firm   Recent Labs    12/01/21 0921 12/02/21 0445  HGB 11.2* 10.9*  HCT 35.1* 33.9*    Assessment/Plan: Plan for discharge tomorrow   LOS: 1 day   12/04/21, MD 12/02/2021, 7:22 AM

## 2021-12-03 MED ORDER — IBUPROFEN 800 MG PO TABS: 800.0000 mg | ORAL_TABLET | Freq: Three times a day (TID) | ORAL | 1 refills | Status: AC | PRN

## 2021-12-03 NOTE — Plan of Care (Signed)
Discharge instructions given with after visit summary, pt receptive.  

## 2021-12-03 NOTE — Progress Notes (Signed)
Post Partum Day 2 Subjective: up ad lib, voiding, and tolerating PO  Pain controlled and baby feeding well  Objective: Blood pressure 111/69, pulse (!) 56, temperature 98.3 F (36.8 C), temperature source Oral, resp. rate 16, height 4\' 11"  (1.499 m), weight 66.6 kg, last menstrual period 02/26/2021, SpO2 100 %, unknown if currently breastfeeding.  Physical Exam:  General: alert and cooperative Lochia: appropriate Uterine Fundus: firm   Recent Labs    12/01/21 0921 12/02/21 0445  HGB 11.2* 10.9*  HCT 35.1* 33.9*     Assessment/Plan: Discharge home s/p VAVD (FHR decels, maternal fatigue)   LOS: 2 days   12/04/21, MD 12/03/2021, 9:41 AM

## 2021-12-03 NOTE — Discharge Summary (Signed)
Postpartum Discharge Summary  Date of Service updated     Patient Name: Sarah Harris DOB: 12-Sep-1990 MRN: [id number]  Date of admission: 12/01/2021 Delivery date:12/01/2021  Delivering provider: Huel Cote  Date of discharge: 12/03/2021  Admitting diagnosis: Encounter for elective induction of labor [Z34.90] Vacuum-assisted vaginal delivery [Z37.9] Intrauterine pregnancy: [redacted]w[redacted]d     Secondary diagnosis:  Principal Problem:   Encounter for elective induction of labor Active Problems:   Vacuum-assisted vaginal delivery  Additional problems: none    Discharge diagnosis: Term Pregnancy Delivered                                              Post partum procedures: none Augmentation: Pitocin Complications: None  Hospital course: Induction of Labor With Vaginal Delivery   31 y.o. yo G3P3003 at [redacted]w[redacted]d was admitted to the hospital 12/01/2021 for induction of labor.  Indication for induction: Favorable cervix at term.  Patient had an uncomplicated labor course as follows: Membrane Rupture Time/Date: 1:12 PM ,12/01/2021   Delivery Method:Vaginal, Vacuum (Extractor)  Episiotomy: None  Lacerations:  2nd degree;Perineal  Details of delivery can be found in separate delivery note.  Patient had a routine postpartum course. Patient is discharged home 12/03/21.  Newborn Data: Birth date:12/01/2021  Birth time:10:29 PM  Gender:Female  Living status:Living  Apgars:8 ,9  Weight:3400 g   Physical exam  Vitals:   12/02/21 0940 12/02/21 1325 12/02/21 2350 12/03/21 0548  BP: 116/63 113/64 106/62 111/69  Pulse: 63 68 70 (!) 56  Resp: 14 16 16 16   Temp: 98.7 F (37.1 C) 97.9 F (36.6 C) 98 F (36.7 C) 98.3 F (36.8 C)  TempSrc: Oral Oral Oral Oral  SpO2: 100% 100%    Weight:      Height:       General: alert, cooperative, and no distress Lochia: appropriate Uterine Fundus: firm Incision: N/A DVT Evaluation: No evidence of DVT seen on physical exam. Negative Homan's  sign. No cords or calf tenderness. Labs: Lab Results  Component Value Date   WBC 14.8 (H) 12/02/2021   HGB 10.9 (L) 12/02/2021   HCT 33.9 (L) 12/02/2021   MCV 77.4 (L) 12/02/2021   PLT 213 12/02/2021       No data to display         Edinburgh Score:    12/02/2021   12:18 AM  Edinburgh Postnatal Depression Scale Screening Tool  I have been able to laugh and see the funny side of things. 0  I have looked forward with enjoyment to things. 0  I have blamed myself unnecessarily when things went wrong. 0  I have been anxious or worried for no good reason. 1  I have felt scared or panicky for no good reason. 0  Things have been getting on top of me. 0  I have been so unhappy that I have had difficulty sleeping. 0  I have felt sad or miserable. 0  I have been so unhappy that I have been crying. 0  The thought of harming myself has occurred to me. 0  Edinburgh Postnatal Depression Scale Total 1      After visit meds:  Allergies as of 12/03/2021   No Known Allergies      Medication List     TAKE these medications    ibuprofen 800 MG tablet Commonly known as: ADVIL Take  1 tablet (800 mg total) by mouth every 8 (eight) hours as needed. What changed:  medication strength how much to take when to take this reasons to take this   prenatal multivitamin Tabs tablet Take 1 tablet by mouth daily at 12 noon.         Discharge home in stable condition Infant Feeding: Breast Infant Disposition:home with mother Discharge instruction: per After Visit Summary and Postpartum booklet. Activity: Advance as tolerated. Pelvic rest for 6 weeks.  Diet: routine diet Anticipated Birth Control: Plans Interval BTL Postpartum Appointment:6 weeks Follow up Visit:  Follow-up Information     Huel Cote, MD. Schedule an appointment as soon as possible for a visit in 6 week(s).   Specialty: Obstetrics and Gynecology Why: Routine pp exam Contact information: [address]  [address] [place] [address] [phone]                     12/03/2021 Carlisle Cater, MD

## 2021-12-10 ENCOUNTER — Telehealth (HOSPITAL_COMMUNITY): Payer: Self-pay | Admitting: *Deleted

## 2021-12-10 NOTE — Telephone Encounter (Signed)
Mom reports feeling good. No concerns about herself at this time. EPDS=1 [facility] score=1) Mom reports baby is doing well. Feeding, peeing, and pooping without difficulty. Safe sleep reviewed. Mom reports no concerns about baby at present.  Duffy Rhody, RN 12-10-2021 at 12:55pm

## 2022-12-31 IMAGING — US US MFM OB DETAIL+14 WK
1 series · 13 of 28 positions shown · non-contrast
Comparison: none

[Series 1: us mfm ob detail+14 wk · 13 of 125 slices shown]
[im 5/125]
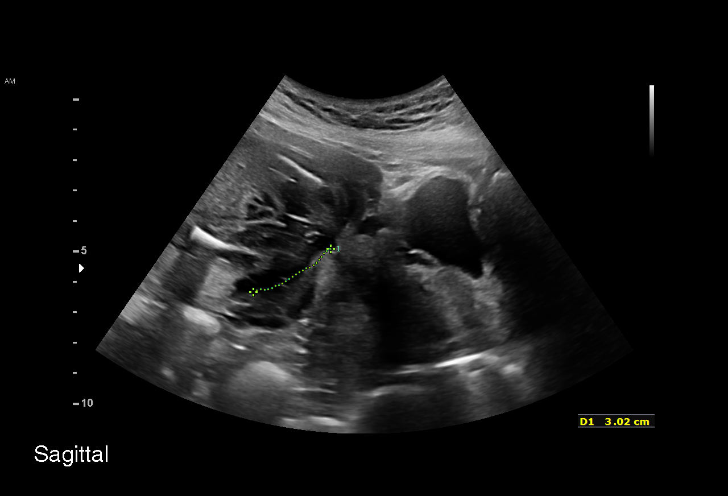
[im 14/125]
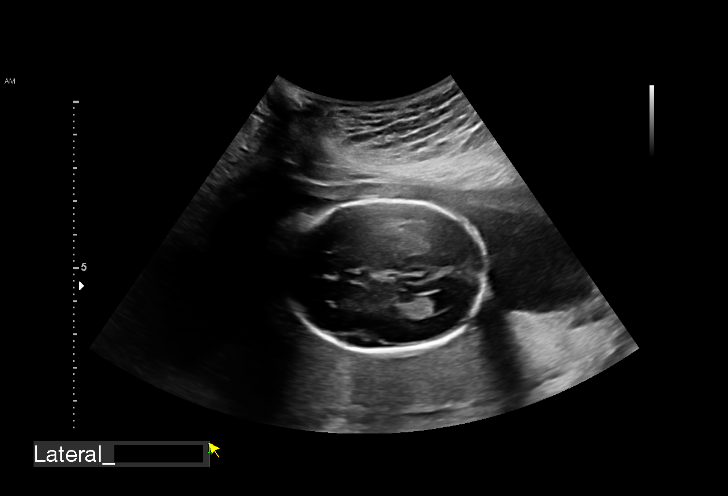
[im 23/125]
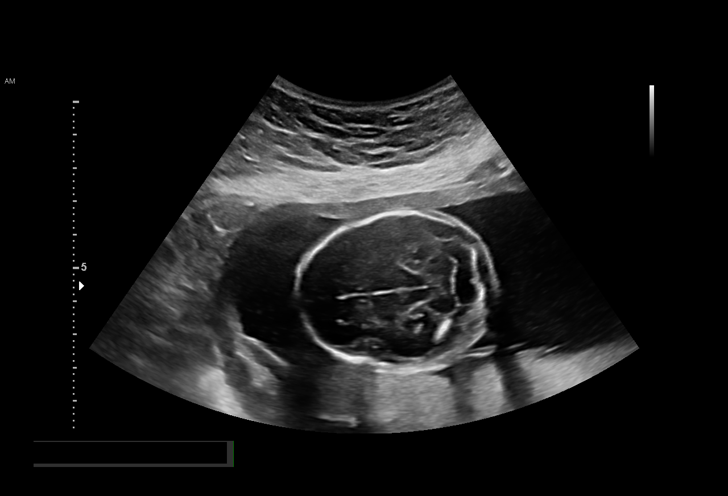
[im 33/125]
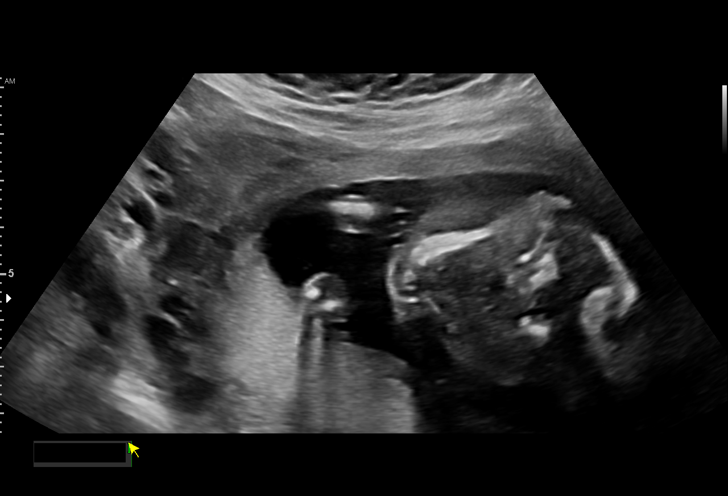
[im 42/125]
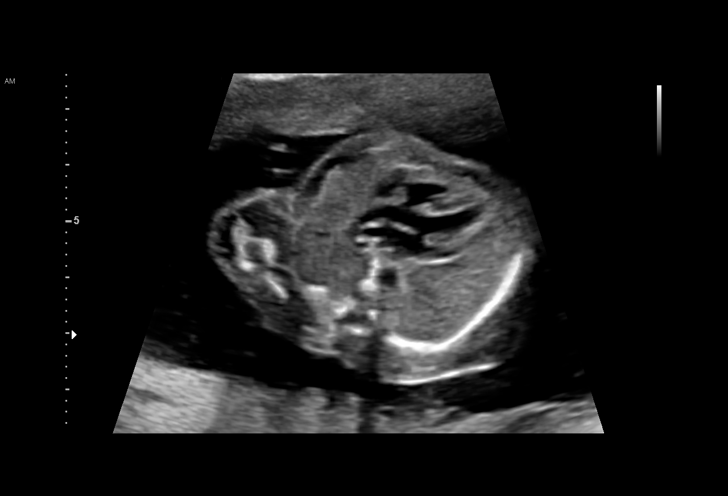
[im 51/125]
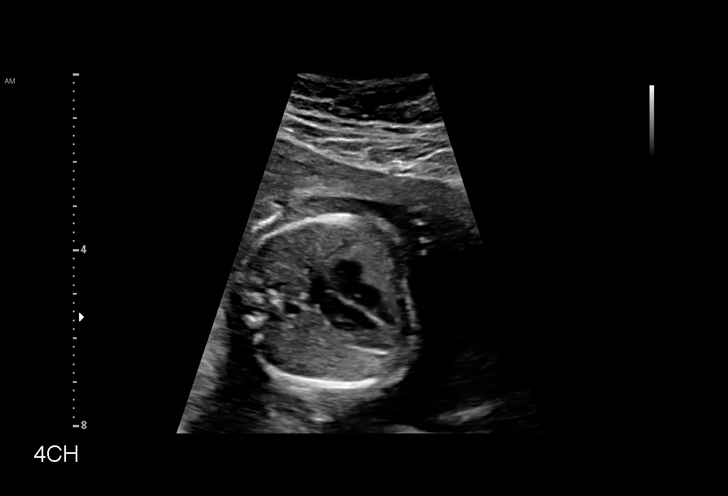
[im 65/125]
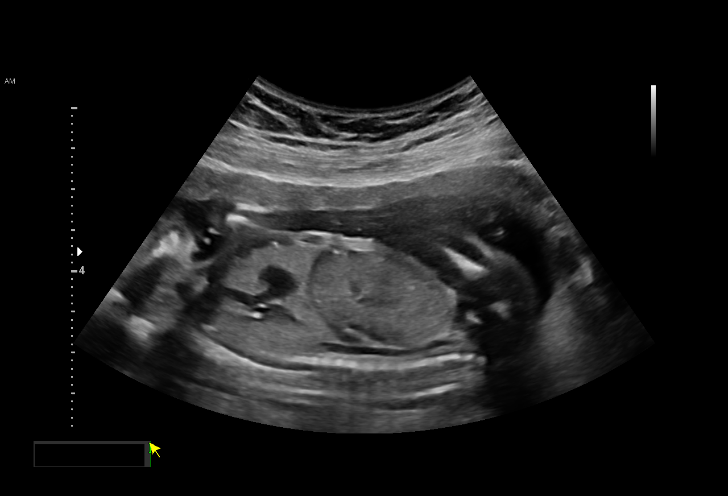
[im 74/125]
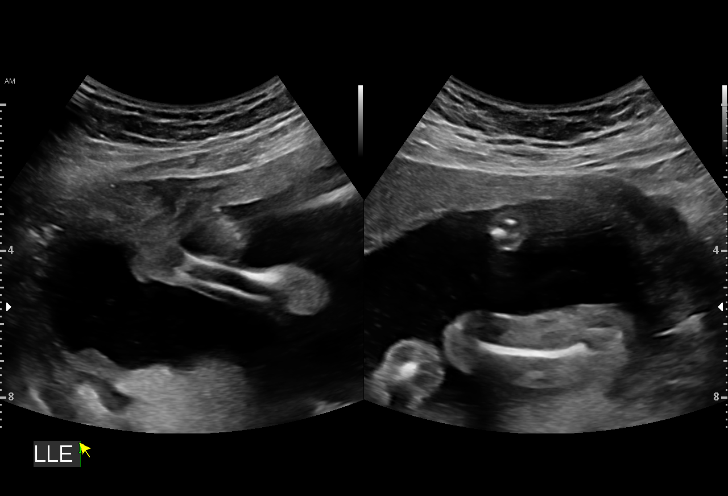
[im 83/125]
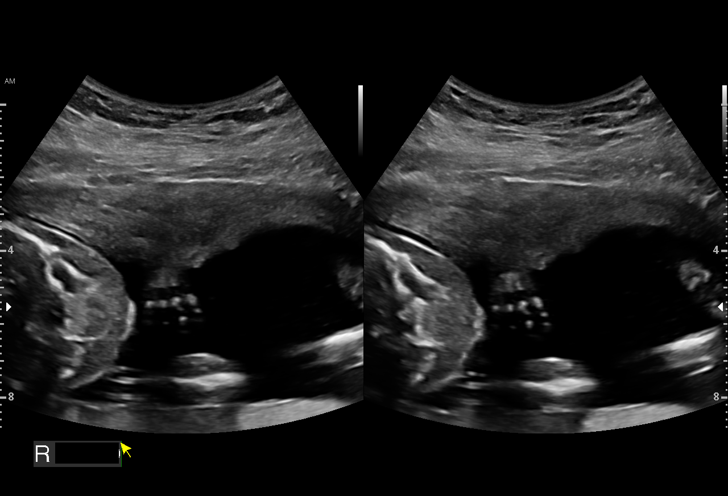
[im 92/125]
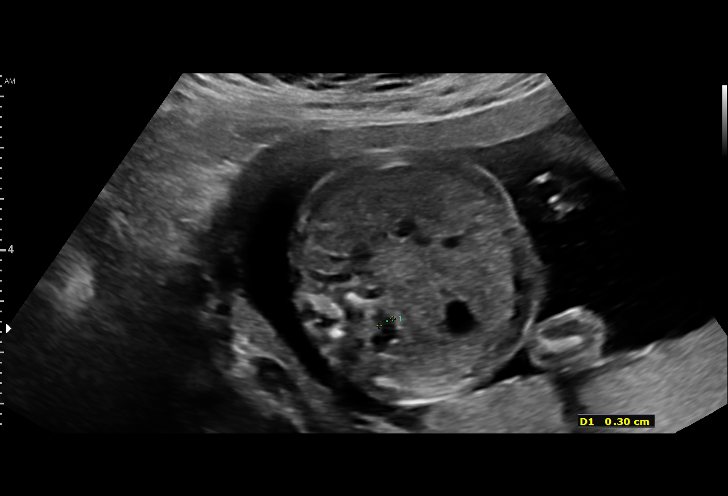
[im 102/125]
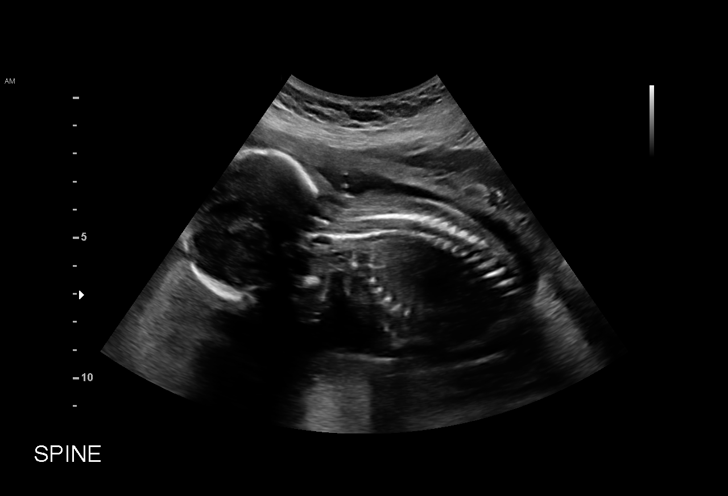
[im 111/125]
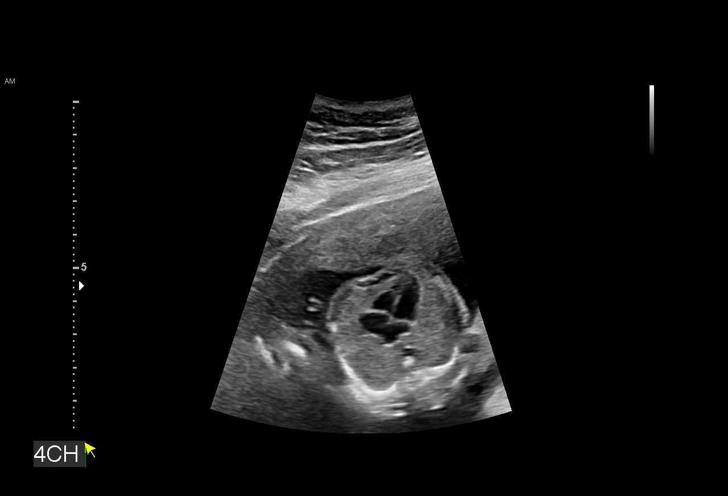
[im 120/125]
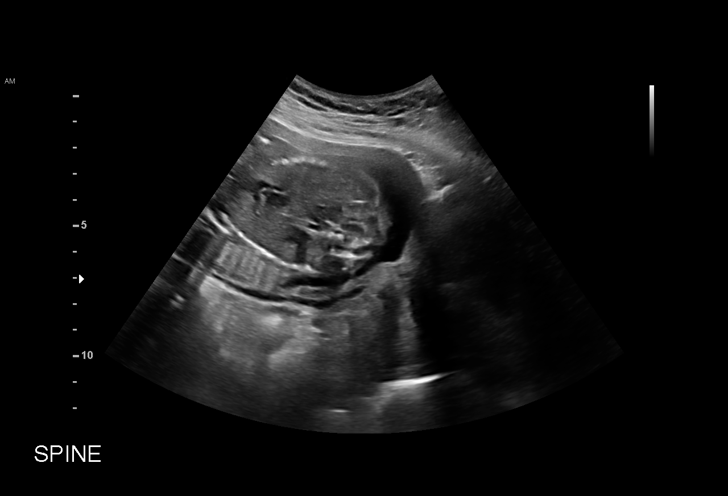

[13 of 28 positions shown; findings below may reference images not displayed]

[HOSPITAL],
                                                            Inc.

Indications

 Fetal abnormality - other known or
 suspected (Prominent Cisterna Magna)
 20 weeks gestation of pregnancy
 Antenatal screening for malformations
 Declined genetic screening
Fetal Evaluation

 Num Of Fetuses:         1
 Fetal Heart Rate(bpm):  138
 Cardiac Activity:       Observed
 Presentation:           Breech
 Placenta:               Posterior
 P. Cord Insertion:      Visualized

 Amniotic Fluid
 AFI FV:      Within normal limits
Biometry

 BPD:      45.8  mm     G. Age:  19w 6d         26  %    CI:        70.22   %    70 - 86
                                                         FL/HC:      17.6   %    16.8 -
 HC:      174.3  mm     G. Age:  20w 0d         22  %    HC/AC:      1.15        1.09 -
 AC:      151.5  mm     G. Age:  20w 3d         42  %    FL/BPD:     67.0   %
 FL:       30.7  mm     G. Age:  19w 4d         14  %    FL/AC:      20.3   %    20 - 24
 CER:      21.2  mm     G. Age:  20w 1d         58  %
 NFT:       3.2  mm

 LV:        5.7  mm
 CM:        6.1  mm

 Est. FW:     324  gm    0 lb 11 oz      22  %
OB History

 Gravidity:    3         Term:   2        Prem:   0        SAB:   0
 TOP:          0       Ectopic:  0        Living: 2
Gestational Age

 LMP:           20w 3d        Date:  02/26/21                 EDD:   12/03/21
 U/S Today:     20w 0d                                        EDD:   12/06/21
 Best:          20w 3d     Det. By:  LMP  (02/26/21)          EDD:   12/03/21
Anatomy

 Cranium:               Appears normal         Aortic Arch:            Appears normal
 Cavum:                 Appears normal         Ductal Arch:            Appears normal
 Ventricles:            Appears normal         Diaphragm:              Appears normal
 Choroid Plexus:        Appears normal         Stomach:                Appears normal, left
                                                                       sided
 Cerebellum:            Appears normal         Abdomen:                Appears normal
 Posterior Fossa:       Appears normal         Abdominal Wall:         Appears nml (cord
                                                                       insert, abd wall)
 Nuchal Fold:           Appears normal         Cord Vessels:           Appears normal (3
                                                                       vessel cord)
 Face:                  Appears normal         Kidneys:                Appear normal
                        (orbits and profile)
 Lips:                  Appears normal         Bladder:                Appears normal
 Thoracic:              Appears normal         Spine:                  Appears normal
 Heart:                 Appears normal         Upper Extremities:      Appears normal
                        (4CH, axis, and
                        situs)
 RVOT:                  Appears normal         Lower Extremities:      Appears normal
 LVOT:                  Appears normal

 Other:  Nasal bone, lenses, maxilla, mandible and falx visualized Heels/feet
         and open hands/5th digits visualized. VC, 3VV and 3VTV visualized.
         Fetus appears to be female.
Cervix Uterus Adnexa

 Cervix
 Length:           3.75  cm.
 Normal appearance by transabdominal scan.

 Uterus
 No abnormality visualized.

 Right Ovary
 Within normal limits.

 Left Ovary
 Within normal limits.
Impression

 G3 P2.  Patient is here for a second opinion ultrasound.  At
 your office ultrasound, enlarged cisterna magna was seen.
 Additionally, amniotic fluid/was suspected at the internal os.

 Obstetrical history significant for 2 term vaginal deliveries.
 Patient had opted not to screen for fetal aneuploidies.

 We performed fetal anatomy scan. No makers of
 aneuploidies or fetal structural defects are seen.  The
 cisterna magna appears normal.  No intracranial structural
 defects are seen.  Fetal biometry is consistent with her
 previously-established dates. Amniotic fluid is normal and
 good fetal activity is seen.  No amniotic fluid sludges seen.
 Patient understands the limitations of ultrasound in detecting
 fetal anomalies.
 We reassured the patient of the findings.
Recommendations

 Follow-up scans as clinically indicated.
                 Botoreh, Kaldeq
